# Patient Record
Sex: Male | Born: 1955 | Race: Asian | Hispanic: No | Marital: Single | Smoking: Current every day smoker
Health system: Southern US, Community
[De-identification: ages and names within clinical notes are randomized; demographics above are authoritative.]

## PROBLEM LIST (undated history)

## (undated) DIAGNOSIS — I10 Essential (primary) hypertension: Secondary | ICD-10-CM

## (undated) DIAGNOSIS — E119 Type 2 diabetes mellitus without complications: Secondary | ICD-10-CM

---

## 2016-01-29 ENCOUNTER — Encounter: Payer: Self-pay | Admitting: Emergency Medicine

## 2016-01-29 ENCOUNTER — Emergency Department
Admission: EM | Admit: 2016-01-29 | Discharge: 2016-01-29 | Disposition: A | Discharge status: Home / Self Care | Payer: Self-pay | Attending: Emergency Medicine | Admitting: Emergency Medicine

## 2016-01-29 ENCOUNTER — Emergency Department: Payer: Self-pay

## 2016-01-29 DIAGNOSIS — R109 Unspecified abdominal pain: Secondary | ICD-10-CM

## 2016-01-29 DIAGNOSIS — I10 Essential (primary) hypertension: Secondary | ICD-10-CM | POA: Insufficient documentation

## 2016-01-29 DIAGNOSIS — N200 Calculus of kidney: Secondary | ICD-10-CM | POA: Insufficient documentation

## 2016-01-29 DIAGNOSIS — E119 Type 2 diabetes mellitus without complications: Secondary | ICD-10-CM | POA: Insufficient documentation

## 2016-01-29 DIAGNOSIS — F17200 Nicotine dependence, unspecified, uncomplicated: Secondary | ICD-10-CM | POA: Insufficient documentation

## 2016-01-29 HISTORY — DX: Type 2 diabetes mellitus without complications: E11.9

## 2016-01-29 HISTORY — DX: Essential (primary) hypertension: I10

## 2016-01-29 LAB — URINALYSIS, COMPLETE (UACMP) WITH MICROSCOPIC
Bacteria, UA: NONE SEEN
Bilirubin Urine: NEGATIVE
Ketones, ur: 5 mg/dL — AB
Leukocytes, UA: NEGATIVE
NITRITE: NEGATIVE
Protein, ur: NEGATIVE mg/dL
SPECIFIC GRAVITY, URINE: 1.024 (ref 1.005–1.030)
pH: 5 (ref 5.0–8.0)

## 2016-01-29 LAB — CBC
HCT: 49.2 % (ref 40.0–52.0)
HEMOGLOBIN: 16.6 g/dL (ref 13.0–18.0)
MCH: 30.8 pg (ref 26.0–34.0)
MCHC: 33.7 g/dL (ref 32.0–36.0)
MCV: 91.7 fL (ref 80.0–100.0)
Platelets: 142 10*3/uL — ABNORMAL LOW (ref 150–440)
RBC: 5.37 MIL/uL (ref 4.40–5.90)
RDW: 13.6 % (ref 11.5–14.5)
WBC: 10.5 10*3/uL (ref 3.8–10.6)

## 2016-01-29 LAB — COMPREHENSIVE METABOLIC PANEL
ALK PHOS: 79 U/L (ref 38–126)
ALT: 24 U/L (ref 17–63)
ANION GAP: 9 (ref 5–15)
AST: 27 U/L (ref 15–41)
Albumin: 4.6 g/dL (ref 3.5–5.0)
BILIRUBIN TOTAL: 1.8 mg/dL — AB (ref 0.3–1.2)
BUN: 15 mg/dL (ref 6–20)
CALCIUM: 9.2 mg/dL (ref 8.9–10.3)
CO2: 23 mmol/L (ref 22–32)
Chloride: 105 mmol/L (ref 101–111)
Creatinine, Ser: 1.11 mg/dL (ref 0.61–1.24)
GLUCOSE: 234 mg/dL — AB (ref 65–99)
Potassium: 4.2 mmol/L (ref 3.5–5.1)
Sodium: 137 mmol/L (ref 135–145)
TOTAL PROTEIN: 7.9 g/dL (ref 6.5–8.1)

## 2016-01-29 LAB — LIPASE, BLOOD: Lipase: 26 U/L (ref 11–51)

## 2016-01-29 MED ORDER — KETOROLAC TROMETHAMINE 30 MG/ML IJ SOLN
INTRAMUSCULAR | Status: AC
  Filled 2016-01-29: qty 1

## 2016-01-29 MED ORDER — ONDANSETRON HCL 4 MG PO TABS: 4.0000 mg | ORAL_TABLET | Freq: Three times a day (TID) | ORAL | 0 refills | Status: DC | PRN

## 2016-01-29 MED ORDER — TRAMADOL HCL 50 MG PO TABS: 50.0000 mg | ORAL_TABLET | Freq: Four times a day (QID) | ORAL | 0 refills | Status: AC | PRN

## 2016-01-29 MED ORDER — KETOROLAC TROMETHAMINE 60 MG/2ML IM SOLN
60.0000 mg | Freq: Once | INTRAMUSCULAR | Status: AC
Start: 2016-01-29 — End: 2016-01-29
  Administered 2016-01-29: 60 mg via INTRAMUSCULAR
  Filled 2016-01-29: qty 2

## 2016-01-29 MED ORDER — TAMSULOSIN HCL 0.4 MG PO CAPS: 0.4000 mg | ORAL_CAPSULE | Freq: Every day | ORAL | 0 refills | Status: DC

## 2016-01-29 NOTE — ED Provider Notes (Signed)
[facility] Emergency Department Provider Note    ____________________________________________   I have reviewed the triage vital signs and the nursing notes.   HISTORY  Chief Complaint Abdominal Pain   History limited by: Not Limited   HPI Randall Glass is a 60 y.o. male who presents to the emergency department today because of concern for right lower quadrant abdominal pain. The pain has been going on for two days. The patient states it is located throughout the right side of his abdomen and does go into his groin. He has been having a hard time with urination with the pain. He denies any vomiting. Denies similar symptoms in the past.    Past Medical History:  Diagnosis Date  . Diabetes mellitus without complication (HCC)   . Hypertension     There are no active problems to display for this patient.   History reviewed. No pertinent surgical history.  Prior to Admission medications   Not on File    Allergies Patient has no known allergies.  No family history on file.  Social History Social History  Substance Use Topics  . Smoking status: Current Every Day Smoker  . Smokeless tobacco: Not on file     Comment: smokes hookah 1 time a day  . Alcohol use Not on file    Review of Systems  Constitutional: Negative for fever. Cardiovascular: Negative for chest pain. Respiratory: Negative for shortness of breath. Gastrointestinal: Positive for right lower quadrant pain. Genitourinary: Negative for dysuria. Musculoskeletal: Negative for back pain. Skin: Negative for rash. Neurological: Negative for headaches, focal weakness or numbness.  10-point ROS otherwise negative.  ____________________________________________   PHYSICAL EXAM:  VITAL SIGNS: ED Triage Vitals  Enc Vitals Group     BP 01/29/16 1425 (!) 154/99     Pulse Rate 01/29/16 1425 84     Resp 01/29/16 1425 18     Temp 01/29/16 1425 97.4 F (36.3 C)     Temp Source  01/29/16 1425 Oral     SpO2 01/29/16 1425 97 %     Weight 01/29/16 1426 170 lb (77.1 kg)     Height 01/29/16 1426 5\' 6"  (1.676 m)     Head Circumference --      Peak Flow --      Pain Score 01/29/16 1426 10    Constitutional: Alert and oriented. Well appearing and in no distress. Eyes: Conjunctivae are normal. Normal extraocular movements. ENT   Head: Normocephalic and atraumatic.   Nose: No congestion/rhinnorhea.   Mouth/Throat: Mucous membranes are moist.   Neck: No stridor. Hematological/Lymphatic/Immunilogical: No cervical lymphadenopathy. Cardiovascular: Normal rate, regular rhythm.  No murmurs, rubs, or gallops. Respiratory: Normal respiratory effort without tachypnea nor retractions. Breath sounds are clear and equal bilaterally. No wheezes/rales/rhonchi. Gastrointestinal: Soft and minimally tender to palpation in the right side of the abdomen. No rebound. No guarding. Genitourinary: Deferred Musculoskeletal: Normal range of motion in all extremities. No lower extremity edema. Neurologic:  Normal speech and language. No gross focal neurologic deficits are appreciated.  Skin:  Skin is warm, dry and intact. No rash noted. Psychiatric: Mood and affect are normal. Speech and behavior are normal. Patient exhibits appropriate insight and judgment.  ____________________________________________    LABS (pertinent positives/negatives)  Labs Reviewed  COMPREHENSIVE METABOLIC PANEL - Abnormal; Notable for the following:       Result Value   Glucose, Bld 234 (*)    Total Bilirubin 1.8 (*)    All other components within normal limits  CBC - Abnormal; Notable for the following:    Platelets 142 (*)    All other components within normal limits  URINALYSIS, COMPLETE (UACMP) WITH MICROSCOPIC - Abnormal; Notable for the following:    Color, Urine YELLOW (*)    APPearance CLEAR (*)    Glucose, UA >=500 (*)    Hgb urine dipstick MODERATE (*)    Ketones, ur 5 (*)     Squamous Epithelial / LPF 0-5 (*)    All other components within normal limits  LIPASE, BLOOD     ____________________________________________   EKG  None  ____________________________________________    RADIOLOGY  CT renal stone IMPRESSION:  Mild to moderate right hydronephrosis due to a 0.2 cm stone at the  right UVJ. No other acute abnormality.    Mild prostatomegaly.    Fatty infiltration of the liver.    Atherosclerosis.   ____________________________________________   PROCEDURES  Procedures  ____________________________________________   INITIAL IMPRESSION / ASSESSMENT AND PLAN / ED COURSE  Pertinent labs & imaging results that were available during my care of the patient were reviewed by me and considered in my medical decision making (see chart for details).  Patient presented to the emergency department today with concerns for right-sided abdominal pain. Workup is consistent with kidney stone. No signs of infection. Some mild to moderate hydronephrosis. Will plan on discharging with pain medication as medication Flomax.  ____________________________________________   FINAL CLINICAL IMPRESSION(S) / ED DIAGNOSES  Final diagnoses:  Right flank pain     Note: This dictation was prepared with Dragon dictation. Any transcriptional errors that result from this process are unintentional     Phineas SemenGraydon Brandye Inthavong, MD 01/29/16 1904

## 2016-01-29 NOTE — ED Notes (Signed)
The patient has a specimen cup but does not have to use the bathroom at this time.

## 2016-01-29 NOTE — ED Notes (Signed)
Right flank pain, urinary frequency and urgency. A/o. Vs wnl

## 2016-01-29 NOTE — Discharge Instructions (Signed)
Please seek medical attention for any high fevers, chest pain, shortness of breath, change in behavior, persistent vomiting, bloody stool or any other new or concerning symptoms.  

## 2016-01-29 NOTE — ED Triage Notes (Signed)
States lower R abdominal and flank pain since yesterday. Denies dysuria.

## 2016-01-31 ENCOUNTER — Emergency Department
Admission: EM | Admit: 2016-01-31 | Discharge: 2016-01-31 | Disposition: A | Discharge status: Home / Self Care | Payer: Self-pay | Attending: Emergency Medicine | Admitting: Emergency Medicine

## 2016-01-31 DIAGNOSIS — I10 Essential (primary) hypertension: Secondary | ICD-10-CM | POA: Insufficient documentation

## 2016-01-31 DIAGNOSIS — Z79899 Other long term (current) drug therapy: Secondary | ICD-10-CM | POA: Insufficient documentation

## 2016-01-31 DIAGNOSIS — F17200 Nicotine dependence, unspecified, uncomplicated: Secondary | ICD-10-CM | POA: Insufficient documentation

## 2016-01-31 DIAGNOSIS — N200 Calculus of kidney: Secondary | ICD-10-CM | POA: Insufficient documentation

## 2016-01-31 DIAGNOSIS — E119 Type 2 diabetes mellitus without complications: Secondary | ICD-10-CM | POA: Insufficient documentation

## 2016-01-31 DIAGNOSIS — K5900 Constipation, unspecified: Secondary | ICD-10-CM | POA: Insufficient documentation

## 2016-01-31 MED ORDER — POLYETHYLENE GLYCOL 3350 17 G PO PACK
PACK | ORAL | Status: AC
  Administered 2016-01-31: 17 g via ORAL
  Filled 2016-01-31: qty 1

## 2016-01-31 MED ORDER — SODIUM CHLORIDE 0.9 % IV BOLUS (SEPSIS)
1000.0000 mL | Freq: Once | INTRAVENOUS | Status: AC
  Administered 2016-01-31: 1000 mL via INTRAVENOUS

## 2016-01-31 MED ORDER — KETOROLAC TROMETHAMINE 30 MG/ML IJ SOLN
30.0000 mg | Freq: Once | INTRAMUSCULAR | Status: AC
  Administered 2016-01-31: 30 mg via INTRAVENOUS
  Filled 2016-01-31: qty 1

## 2016-01-31 MED ORDER — POLYETHYLENE GLYCOL 3350 17 G PO PACK
17.0000 g | PACK | Freq: Every day | ORAL | Status: DC
  Administered 2016-01-31: 17 g via ORAL

## 2016-01-31 MED ORDER — ONDANSETRON HCL 4 MG/2ML IJ SOLN
4.0000 mg | Freq: Once | INTRAMUSCULAR | Status: AC
  Administered 2016-01-31: 4 mg via INTRAVENOUS
  Filled 2016-01-31: qty 2

## 2016-01-31 NOTE — ED Notes (Signed)
MD Brown at bedside.

## 2016-01-31 NOTE — ED Notes (Signed)
Pt. Verbalizes understanding of d/c instructions and follow-up. VS stable and pain controlled per pt.  Pt. In NAD at time of d/c and denies further concerns regarding this visit. Pt. Stable at the time of departure from the unit, departing unit by the safest and most appropriate manner per that pt condition and limitations. Pt advised to return to the ED at any time for emergent concerns, or for new/worsening symptoms.   

## 2016-01-31 NOTE — ED Provider Notes (Signed)
[facility] Emergency Department Provider Note   First MD Initiated Contact with Patient 01/31/16 0040     (approximate)  I have reviewed the triage vital signs and the nursing notes.   HISTORY  Chief Complaint Flank Pain    HPI Randall Glass is a 60 y.o. male Randall Glass emergency department after being diagnosed with a kidney stone yesterday with recurrence of right flank pain which started at 6 PM tonight. Patient also admits to constipation 3 days. Patient denies any fever afebrile on presentation temperature 97.8.   Past Medical History:  Diagnosis Date  . Diabetes mellitus without complication (HCC)   . Hypertension     There are no active problems to display for this patient.   No past surgical history on file.  Prior to Admission medications   Medication Sig Start Date End Date Taking? Authorizing Provider  ondansetron (ZOFRAN) 4 MG tablet Take 1 tablet (4 mg total) by mouth every 8 (eight) hours as needed for nausea or vomiting. 01/29/16   Randall SemenGraydon Goodman, MD  tamsulosin (FLOMAX) 0.4 MG CAPS capsule Take 1 capsule (0.4 mg total) by mouth daily. 01/29/16   Randall SemenGraydon Goodman, MD  traMADol (ULTRAM) 50 MG tablet Take 1 tablet (50 mg total) by mouth every 6 (six) hours as needed. 01/29/16 01/28/17  Randall SemenGraydon Goodman, MD    Allergies No known drug allergies No family history on file.  Social History Social History  Substance Use Topics  . Smoking status: Current Every Day Smoker  . Smokeless tobacco: Not on file     Comment: smokes hookah 1 time a day  . Alcohol use Not on file    Review of Systems Constitutional: No fever/chills Eyes: No visual changes. ENT: No sore throat. Cardiovascular: Denies chest pain. Respiratory: Denies shortness of breath. Gastrointestinal: Positive for right flank pain  No nausea, no vomiting.  No diarrhea.  No constipation. Genitourinary: Negative for dysuria. Musculoskeletal: Negative for back pain. Skin:  Negative for rash. Neurological: Negative for headaches, focal weakness or numbness.  10-point ROS otherwise negative.  ____________________________________________   PHYSICAL EXAM:  VITAL SIGNS: ED Triage Vitals [01/31/16 0039]  Enc Vitals Group     BP (!) 155/88     Pulse Rate (!) 104     Resp 20     Temp 97.8 F (36.6 C)     Temp Source Oral     SpO2 92 %     Weight 170 lb (77.1 kg)     Height 5\' 6"  (1.676 m)     Head Circumference      Peak Flow      Pain Score 10     Pain Loc      Pain Edu?      Excl. in GC?     Constitutional: Alert and oriented. Well appearing and in no acute distress. Eyes: Conjunctivae are normal. PERRL. EOMI. Head: Atraumatic. Mouth/Throat: Mucous membranes are moist.  Oropharynx non-erythematous. Neck: No stridor.  Cardiovascular: Normal rate, regular rhythm. Good peripheral circulation. Grossly normal heart sounds. Respiratory: Normal respiratory effort.  No retractions. Lungs CTAB. Gastrointestinal: Soft and nontender. No distention.  Musculoskeletal: No lower extremity tenderness nor edema. No gross deformities of extremities. Neurologic:  Normal speech and language. No gross focal neurologic deficits are appreciated.  Skin:  Skin is warm, dry and intact. No rash noted.     Procedures   INITIAL IMPRESSION / ASSESSMENT AND PLAN / ED COURSE  Pertinent labs & imaging results that were  available during my care of the patient were reviewed by me and considered in my medical decision making (see chart for details).     Clinical Course     ____________________________________________  FINAL CLINICAL IMPRESSION(S) / ED DIAGNOSES  Final diagnoses:  Kidney stone  Constipation, unspecified constipation type     MEDICATIONS GIVEN DURING THIS VISIT:  Medications  polyethylene glycol (MIRALAX / GLYCOLAX) packet 17 g (17 g Oral Given 01/31/16 0119)  ketorolac (TORADOL) 30 MG/ML injection 30 mg (30 mg Intravenous Given 01/31/16  0119)  sodium chloride 0.9 % bolus 1,000 mL (1,000 mLs Intravenous New Bag/Given 01/31/16 0058)  ondansetron (ZOFRAN) injection 4 mg (4 mg Intravenous Given 01/31/16 0058)     NEW OUTPATIENT MEDICATIONS STARTED DURING THIS VISIT:  New Prescriptions   No medications on file    Modified Medications   No medications on file    Discontinued Medications   No medications on file     Note:  This document was prepared using Dragon voice recognition software and may include unintentional dictation errors.    Randall Currentandolph N Anjulie Dipierro, MD 01/31/16 [phone]

## 2016-01-31 NOTE — ED Triage Notes (Signed)
Pt was dx with kidney stones here yesterday, today at 1800 started having worsening right flank pain.

## 2016-01-31 NOTE — ED Notes (Signed)
Pt. Presents to tx room diaphoretic, writhing around in bed c/o lack of BM x 3 days, frequent urination, abdominal/flank pain right side.

## 2016-02-01 ENCOUNTER — Emergency Department
Admission: EM | Admit: 2016-02-01 | Discharge: 2016-02-01 | Disposition: A | Discharge status: Home / Self Care | Payer: Self-pay | Attending: Emergency Medicine | Admitting: Emergency Medicine

## 2016-02-01 ENCOUNTER — Encounter: Payer: Self-pay | Admitting: Emergency Medicine

## 2016-02-01 ENCOUNTER — Emergency Department: Payer: Self-pay

## 2016-02-01 DIAGNOSIS — R1030 Lower abdominal pain, unspecified: Secondary | ICD-10-CM | POA: Insufficient documentation

## 2016-02-01 DIAGNOSIS — K5900 Constipation, unspecified: Secondary | ICD-10-CM | POA: Insufficient documentation

## 2016-02-01 DIAGNOSIS — E119 Type 2 diabetes mellitus without complications: Secondary | ICD-10-CM | POA: Insufficient documentation

## 2016-02-01 DIAGNOSIS — R109 Unspecified abdominal pain: Secondary | ICD-10-CM

## 2016-02-01 DIAGNOSIS — F17200 Nicotine dependence, unspecified, uncomplicated: Secondary | ICD-10-CM | POA: Insufficient documentation

## 2016-02-01 DIAGNOSIS — I10 Essential (primary) hypertension: Secondary | ICD-10-CM | POA: Insufficient documentation

## 2016-02-01 LAB — COMPREHENSIVE METABOLIC PANEL
ALK PHOS: 55 U/L (ref 38–126)
ALT: 17 U/L (ref 17–63)
AST: 20 U/L (ref 15–41)
Albumin: 4.2 g/dL (ref 3.5–5.0)
Anion gap: 9 (ref 5–15)
BUN: 14 mg/dL (ref 6–20)
CO2: 25 mmol/L (ref 22–32)
CREATININE: 1.17 mg/dL (ref 0.61–1.24)
Calcium: 8.9 mg/dL (ref 8.9–10.3)
Chloride: 103 mmol/L (ref 101–111)
GFR calc Af Amer: >60 mL/min (ref >60)
Glucose, Bld: 190 mg/dL — ABNORMAL HIGH (ref 65–99)
Potassium: 4.4 mmol/L (ref 3.5–5.1)
Sodium: 137 mmol/L (ref 135–145)
TOTAL PROTEIN: 7.5 g/dL (ref 6.5–8.1)
Total Bilirubin: 2.1 mg/dL — ABNORMAL HIGH (ref 0.3–1.2)

## 2016-02-01 LAB — CBC
HCT: 45.5 % (ref 40.0–52.0)
Hemoglobin: 15.4 g/dL (ref 13.0–18.0)
MCH: 30.3 pg (ref 26.0–34.0)
MCHC: 33.8 g/dL (ref 32.0–36.0)
MCV: 89.8 fL (ref 80.0–100.0)
PLATELETS: 160 10*3/uL (ref 150–440)
RBC: 5.06 MIL/uL (ref 4.40–5.90)
RDW: 13.5 % (ref 11.5–14.5)
WBC: 6.4 10*3/uL (ref 3.8–10.6)

## 2016-02-01 LAB — URINALYSIS, COMPLETE (UACMP) WITH MICROSCOPIC
BILIRUBIN URINE: NEGATIVE
Bacteria, UA: NONE SEEN
Glucose, UA: 150 mg/dL — AB
Ketones, ur: 80 mg/dL — AB
LEUKOCYTES UA: NEGATIVE
NITRITE: NEGATIVE
PH: 5 (ref 5.0–8.0)
Protein, ur: 100 mg/dL — AB
SPECIFIC GRAVITY, URINE: 1.02 (ref 1.005–1.030)

## 2016-02-01 LAB — LIPASE, BLOOD: Lipase: 21 U/L (ref 11–51)

## 2016-02-01 MED ORDER — MINERAL OIL RE ENEM
1.0000 | ENEMA | Freq: Once | RECTAL | Status: AC
  Administered 2016-02-01: 1 via RECTAL

## 2016-02-01 MED ORDER — DOCUSATE SODIUM 100 MG PO CAPS
100.0000 mg | ORAL_CAPSULE | Freq: Once | ORAL | Status: AC
  Administered 2016-02-01: 100 mg via ORAL
  Filled 2016-02-01: qty 1

## 2016-02-01 MED ORDER — LACTULOSE 10 GM/15ML PO SOLN
10.0000 g | Freq: Once | ORAL | Status: AC
  Administered 2016-02-01: 10 g via ORAL
  Filled 2016-02-01: qty 30

## 2016-02-01 MED ORDER — MAGNESIUM CITRATE PO SOLN
0.5000 | Freq: Once | ORAL | Status: AC
  Administered 2016-02-01: 0.5 via ORAL
  Filled 2016-02-01: qty 296

## 2016-02-01 NOTE — ED Notes (Signed)
Enema unsuccessful, no stool in toilet.  2nd soap suds enema given

## 2016-02-01 NOTE — ED Provider Notes (Addendum)
[facility] Emergency Department Provider Note  ____________________________________________   First MD Initiated Contact with Patient 02/01/16 1526     (approximate)  I have reviewed the triage vital signs and the nursing notes.   HISTORY  Chief Complaint Abdominal Pain   HPI Randall Glass is a 60 y.o. male with a history of diabetes and hypertension was presenting with 6 days of lower quadrant cramping. He says that he was seen her several days ago and this is third visit. He was diagnosed with a kidney stone during his first visit and then given medicine for constipation during his second visit. However, he says that he is not passing any gas or having any balance over the past 6 days. He denies any nausea or vomiting at this time.   Past Medical History:  Diagnosis Date  . Diabetes mellitus without complication (HCC)   . Hypertension     There are no active problems to display for this patient.   History reviewed. No pertinent surgical history.  Prior to Admission medications   Medication Sig Start Date End Date Taking? Authorizing Provider  ondansetron (ZOFRAN) 4 MG tablet Take 1 tablet (4 mg total) by mouth every 8 (eight) hours as needed for nausea or vomiting. 01/29/16   Phineas Semen, MD  tamsulosin (FLOMAX) 0.4 MG CAPS capsule Take 1 capsule (0.4 mg total) by mouth daily. 01/29/16   Phineas Semen, MD  traMADol (ULTRAM) 50 MG tablet Take 1 tablet (50 mg total) by mouth every 6 (six) hours as needed. 01/29/16 01/28/17  Phineas Semen, MD    Allergies Patient has no known allergies.  No family history on file.  Social History Social History  Substance Use Topics  . Smoking status: Current Every Day Smoker  . Smokeless tobacco: Never Used     Comment: smokes hookah 1 time a day  . Alcohol use No    Review of Systems Constitutional: No fever/chills Eyes: No visual changes. ENT: No sore throat. Cardiovascular: Denies chest  pain. Respiratory: Denies shortness of breath. Gastrointestinal:  No nausea, no vomiting.  No diarrhea.   Genitourinary: Negative for dysuria. Musculoskeletal: Negative for back pain. Skin: Negative for rash. Neurological: Negative for headaches, focal weakness or numbness.  10-point ROS otherwise negative.  ____________________________________________   PHYSICAL EXAM:  VITAL SIGNS: ED Triage Vitals  Enc Vitals Group     BP 02/01/16 1253 (!) 157/81     Pulse Rate 02/01/16 1253 96     Resp 02/01/16 1253 20     Temp 02/01/16 1253 97.8 F (36.6 C)     Temp Source 02/01/16 1253 Oral     SpO2 02/01/16 1253 97 %     Weight 02/01/16 1254 165 lb (74.8 kg)     Height 02/01/16 1254 5\' 6"  (1.676 m)     Head Circumference --      Peak Flow --      Pain Score 02/01/16 1254 10     Pain Loc --      Pain Edu? --      Excl. in GC? --     Constitutional: Alert and oriented. Well appearing and in no acute distress. Eyes: Conjunctivae are normal. PERRL. EOMI. Head: Atraumatic. Nose: No congestion/rhinnorhea. Mouth/Throat: Mucous membranes are moist.   Neck: No stridor.   Cardiovascular: Normal rate, regular rhythm. Grossly normal heart sounds.  Good peripheral circulation. Respiratory: Normal respiratory effort.  No retractions. Lungs CTAB. Gastrointestinal: Soft With minimal lower abdominal tenderness to palpation.  No distention. No fecal impaction on rectal exam. Musculoskeletal: No lower extremity tenderness nor edema.  No joint effusions. Neurologic:  Normal speech and language. No gross focal neurologic deficits are appreciated. No gait instability. Skin:  Skin is warm, dry and intact. No rash noted. Psychiatric: Mood and affect are normal. Speech and behavior are normal.  ____________________________________________   LABS (all labs ordered are listed, but only abnormal results are displayed)  Labs Reviewed  COMPREHENSIVE METABOLIC PANEL - Abnormal; Notable for the following:        Result Value   Glucose, Bld 190 (*)    Total Bilirubin 2.1 (*)    All other components within normal limits  URINALYSIS, COMPLETE (UACMP) WITH MICROSCOPIC - Abnormal; Notable for the following:    Color, Urine YELLOW (*)    APPearance CLEAR (*)    Glucose, UA 150 (*)    Hgb urine dipstick LARGE (*)    Ketones, ur 80 (*)    Protein, ur 100 (*)    Squamous Epithelial / LPF 0-5 (*)    All other components within normal limits  LIPASE, BLOOD  CBC   ____________________________________________  EKG   ____________________________________________  RADIOLOGY   DG Abd Acute W/Chest (Final result)  Result time 02/01/16 18:11:43  Final result by Rubye OaksMelanie Ehinger, MD (02/01/16 18:11:43)           Narrative:   CLINICAL DATA: Lower abdominal pain. Recent kidney stone. Patient reports no bowel movement for 5 days.  EXAM: DG ABDOMEN ACUTE W/ 1V CHEST  COMPARISON: CT 01/29/2016  FINDINGS: Mild atelectasis at the left lung base. Lungs are otherwise clear. Heart size is normal.  No dilated small bowel loops. There is mild gaseous distention of transverse and splenic flexure of the colon. Large stool burden in the right colon, with small volume of sigmoid colonic stool. The 2 mm stone previously at the right ureterovesicular junction is not seen. No radiopaque calculi. No free air. No acute osseous abnormality.  IMPRESSION: 1. Large stool burden in the right colon, with mild gaseous distention of transverse colon. No small bowel dilatation to suggest small bowel obstruction. Findings suggest colonic ileus. 2. The 2 mm stone at the right ureterovesicular junction on prior CT is not seen, may have passed or is obscured and not well visualized radiographically.   Electronically Signed By: Rubye OaksMelanie Ehinger M.D. On: 02/01/2016 18:11          ____________________________________________   PROCEDURES  Procedure(s) performed:   Procedures  Critical Care  performed:   ____________________________________________   INITIAL IMPRESSION / ASSESSMENT AND PLAN / ED COURSE  Pertinent labs & imaging results that were available during my care of the patient were reviewed by me and considered in my medical decision making (see chart for details).   Clinical Course   ----------------------------------------- 8:43 PM on 02/01/2016 -----------------------------------------  Patient has had 2 soapsuds enemas at this time as well as a mineral oil enema and lactulose as well as mag citrate orally with only minimal progress with his stooling. He still is saying that he has 10 out of 10 lower abdominal pain although he is in no acute distress at this time. He will be admitted to the hospital for a bowel regimen. Signed out to Dr. Emmit PomfretHugelmeyer.  I also discussed the case with Dr. Everlene FarrierPabon of surgery who requested that the patient be admitted to the medicine service.   ____________________________________________   FINAL CLINICAL IMPRESSION(S) / ED DIAGNOSES  Abdominal pain. Constipation.  NEW MEDICATIONS STARTED DURING THIS VISIT:  New Prescriptions   No medications on file     Note:  This document was prepared using Dragon voice recognition software and may include unintentional dictation errors.    Myrna Blazeravid Matthew Nasiah Polinsky, MD 02/01/16 2044  Patient at this time says that he has had 6 large bowel movements. He says that he has no pain. He'll be discharged home. I also instructed him to stop taking his tramadol. Appears that his stone is passed as there is no evidence of stone on his abdominal x-rays. However, he still appears to have some residual blood in the urine. We discussed diet such as taking plenty of water as well as fiber such as fruits and vegetables and fiber cereals and fiber bars. He also has MiraLAX at home to use as needed. He'll be discharged home to follow up with his primary care doctor.    Myrna Blazeravid Matthew Christoph Copelan,  MD 02/01/16 2132

## 2016-02-01 NOTE — ED Triage Notes (Signed)
Patient to ER for lower abdominal pain. Has history of recent kidney stones. States he has had pain for 5 days. States he has not had BM in 5 days as well. Unable to pass flatus.

## 2016-02-01 NOTE — ED Notes (Signed)
Pt had 2 small, pebble like stools.

## 2016-02-01 NOTE — ED Notes (Signed)
Pt sts that he had a BM, "first one in 5 days".  Reports feeling better, MD Schaevitz informed

## 2017-08-11 IMAGING — CT CT RENAL STONE PROTOCOL
2 of 4 series · 16 of 46 positions shown, 18 images · non-contrast
Comparison: None.

CLINICAL DATA: Right abdominal and flank pain since yesterday.

EXAM:
CT ABDOMEN AND PELVIS WITHOUT CONTRAST
TECHNIQUE: Multidetector CT imaging of the abdomen and pelvis was performed
following the standard protocol without IV contrast.

[Series 2: stone full standard · axial · 0.76mm/px · z∈[-653,-228]mm · 13 of 93 slices shown, 15 images]
[im 4/93  soft-tissue]
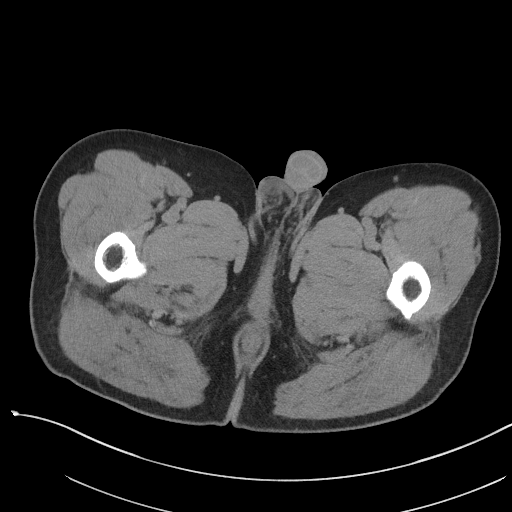
[im 4/93  bone]
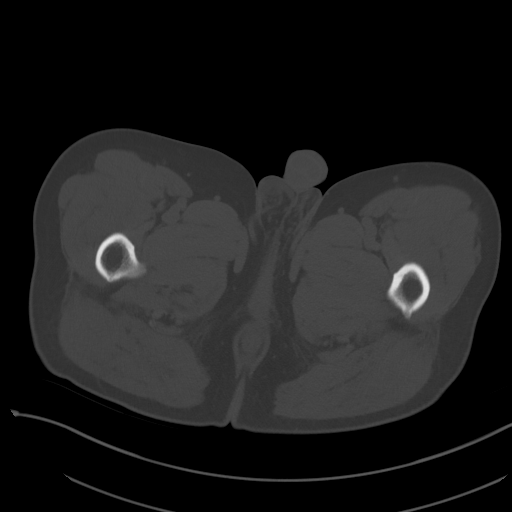
[im 12/93  soft-tissue]
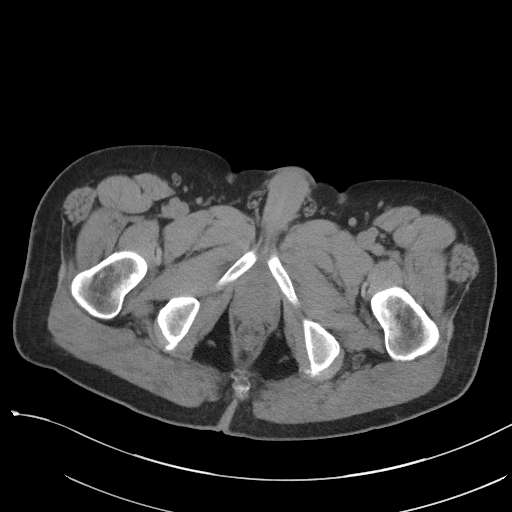
[im 19/93  soft-tissue]
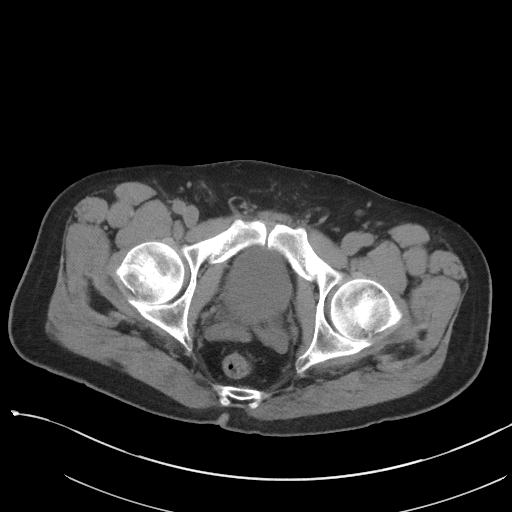
[im 26/93  soft-tissue]
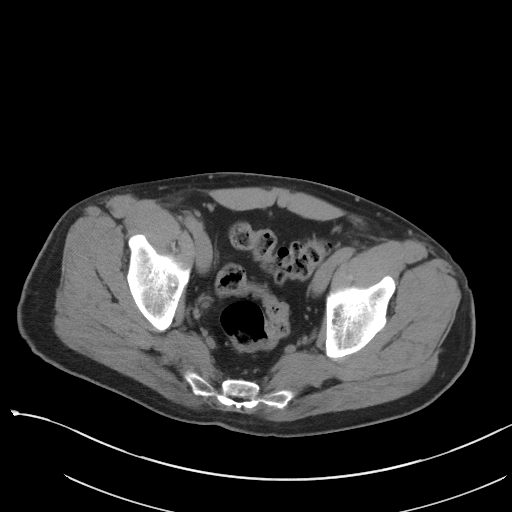
[im 34/93  soft-tissue]
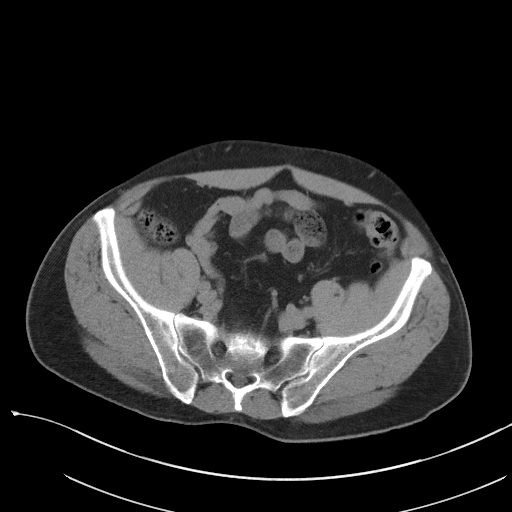
[im 41/93  soft-tissue]
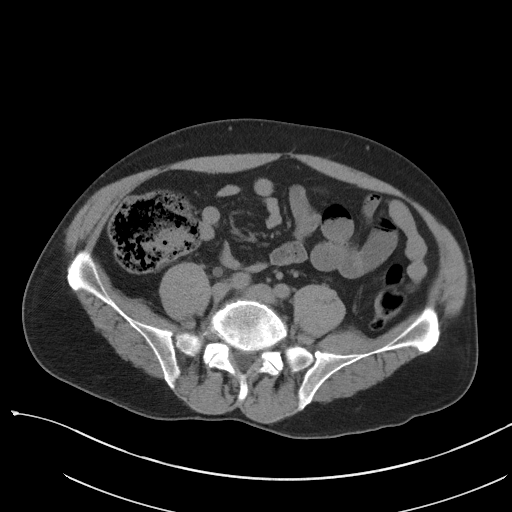
[im 48/93  soft-tissue]
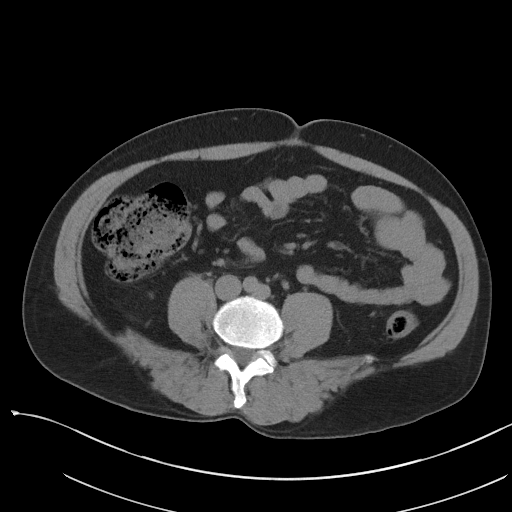
[im 52/93  soft-tissue]
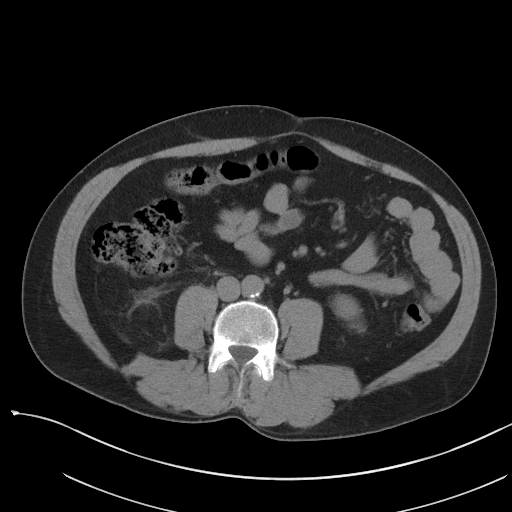
[im 59/93  soft-tissue]
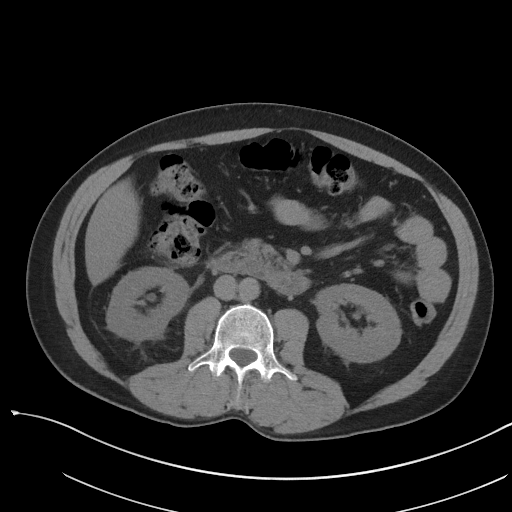
[im 59/93  bone]
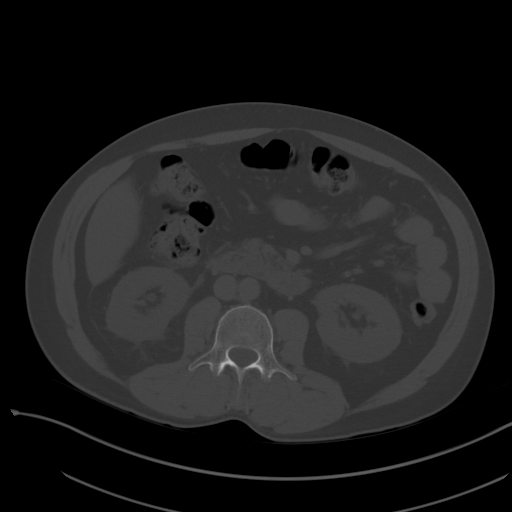
[im 67/93  soft-tissue]
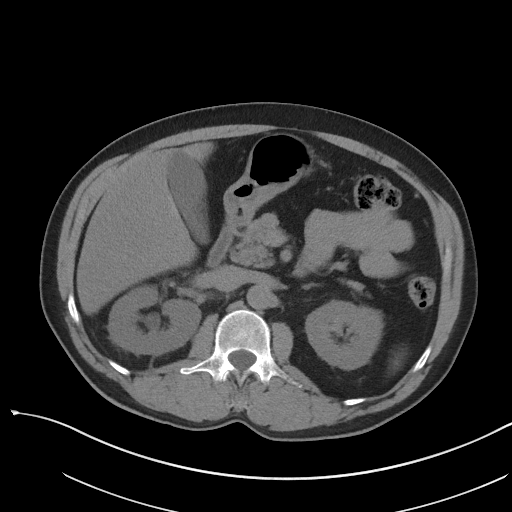
[im 74/93  soft-tissue]
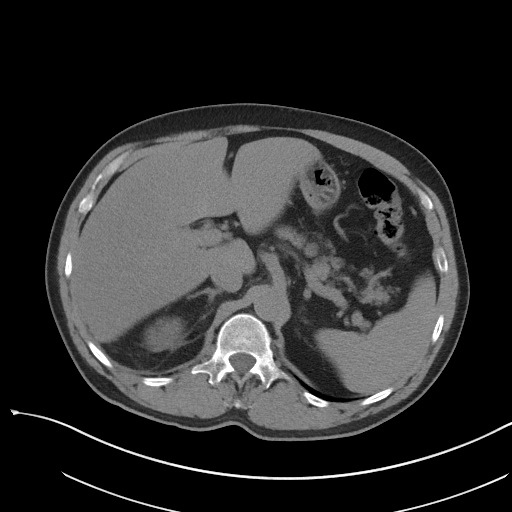
[im 81/93  soft-tissue]
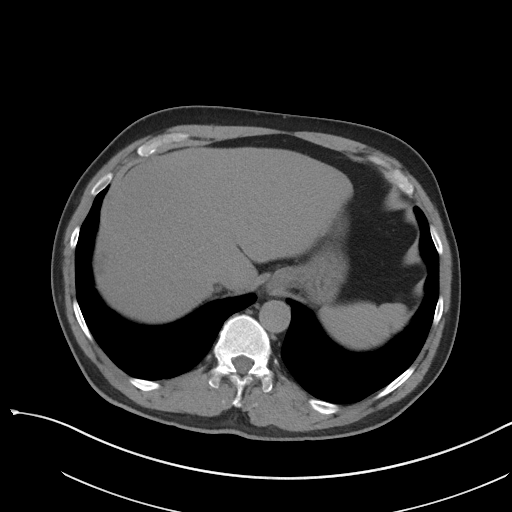
[im 89/93  soft-tissue]
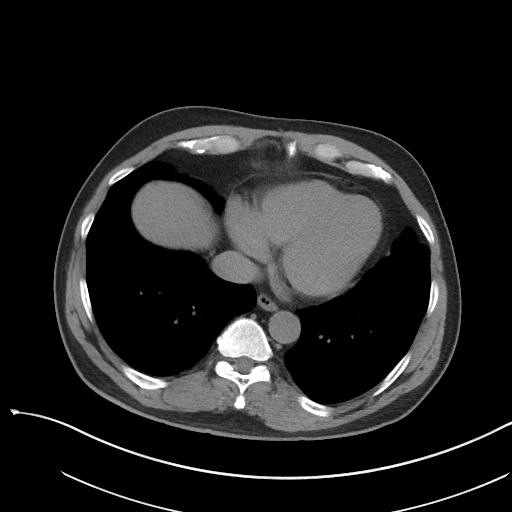

[Series 5: coronal · coronal · 0.78mm/px · 3 of 121 slices shown]
[im 41/121  soft-tissue]
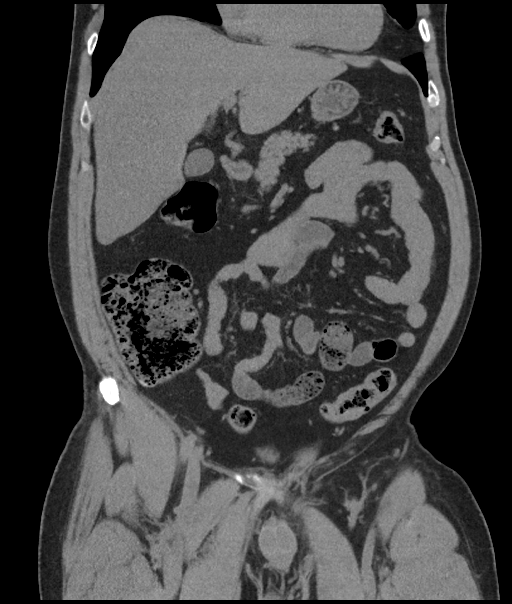
[im 54/121  soft-tissue]
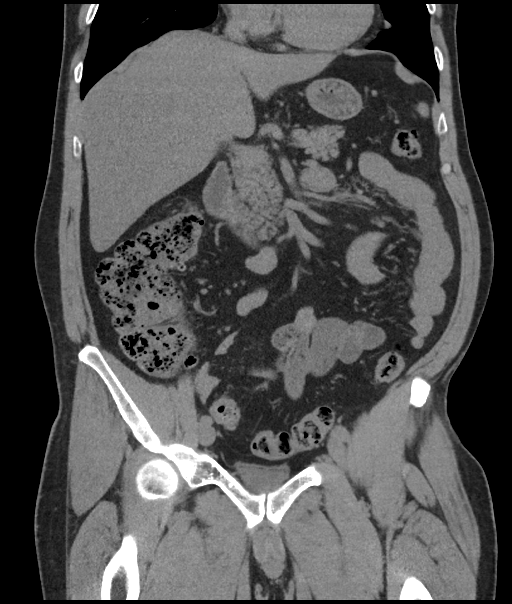
[im 67/121  soft-tissue]
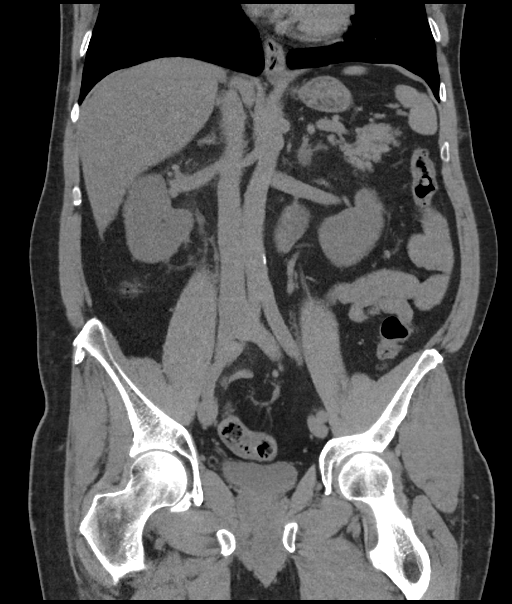

[16 of 46 positions shown; findings below may reference images not displayed]

FINDINGS: Lower chest: Lung bases are clear. No pleural or pericardial
effusion

Hepatobiliary: There is diffuse fatty infiltration of the liver. No
focal lesion. Gallbladder and biliary tree are unremarkable.

Pancreas: Unremarkable. No pancreatic ductal dilatation or
surrounding inflammatory changes.

Spleen: Normal in size without focal abnormality.

Adrenals/Urinary Tract: There is mild to moderate right
hydronephrosis with stranding about the right kidney and ureter due
to a 0.2 cm stone at the right ureterovesical junction. No other
urinary tract stones are identified on the right or left. Small cyst
in the midpole the right kidney is noted. The left kidney, ureter
and urinary bladder all appear normal. The adrenal glands are normal
appearance.

Stomach/Bowel: Stomach is within normal limits. Appendix appears
normal. No evidence of bowel wall thickening, distention, or
inflammatory changes.

Vascular/Lymphatic: A few scattered aortic atherosclerotic
calcifications are identified. No aneurysm. No lymphadenopathy.

Reproductive: Prostate gland is mildly prominent.

Other: No hernia or fluid collection.

Musculoskeletal: Mild appearing degenerative disc disease L4-5 and
L5-S1 noted. Small sclerotic focus in the right femoral neck is
likely a bone island.
IMPRESSION: Mild to moderate right hydronephrosis due to a 0.2 cm stone at the
right UVJ. No other acute abnormality.

Mild prostatomegaly.

Fatty infiltration of the liver.

Atherosclerosis.

## 2017-12-13 ENCOUNTER — Other Ambulatory Visit: Payer: Self-pay

## 2017-12-13 ENCOUNTER — Encounter: Payer: Self-pay | Admitting: Adult Health

## 2017-12-13 ENCOUNTER — Ambulatory Visit: Payer: Self-pay | Admitting: Adult Health

## 2017-12-13 VITALS — BP 120/74 | HR 91 | Temp 98.3°F | Wt 160.0 lb

## 2017-12-13 DIAGNOSIS — R05 Cough: Secondary | ICD-10-CM

## 2017-12-13 DIAGNOSIS — E7849 Other hyperlipidemia: Secondary | ICD-10-CM

## 2017-12-13 DIAGNOSIS — M25562 Pain in left knee: Secondary | ICD-10-CM

## 2017-12-13 DIAGNOSIS — R059 Cough, unspecified: Secondary | ICD-10-CM

## 2017-12-13 DIAGNOSIS — I10 Essential (primary) hypertension: Secondary | ICD-10-CM

## 2017-12-13 DIAGNOSIS — E119 Type 2 diabetes mellitus without complications: Secondary | ICD-10-CM | POA: Insufficient documentation

## 2017-12-13 MED ORDER — LISINOPRIL 2.5 MG PO TABS: 2.5000 mg | ORAL_TABLET | Freq: Every day | ORAL | 0 refills | Status: DC

## 2017-12-13 MED ORDER — METFORMIN HCL 1000 MG PO TABS: 1000.0000 mg | ORAL_TABLET | Freq: Two times a day (BID) | ORAL | 0 refills | Status: DC

## 2017-12-13 MED ORDER — ATORVASTATIN CALCIUM 20 MG PO TABS: 20.0000 mg | ORAL_TABLET | Freq: Every day | ORAL | 0 refills | Status: DC

## 2017-12-13 MED ORDER — GLIPIZIDE ER 10 MG PO TB24: 10.0000 mg | ORAL_TABLET | Freq: Every day | ORAL | 0 refills | Status: DC

## 2017-12-13 NOTE — Progress Notes (Signed)
  A&P:  1. Type 2 diabetes mellitus without complication, without long-term current use of insulin (HCC) Refill provided for metformin and glipizide Check labs F/U 2 weeks  2. Essential hypertension Stable on medication Refill lisinopril Check labs  3. Other hyperlipidemia Check lipid profile. Refill lipitor  4. Acute pain of left knee Patient noticed pain ~ 2 months ago while kneeling to pray on hard surface Has not taken any OTC medication consistently Instructed to avoid kneeling to pray until area healed Aleve bid x 7 days Apply ice to area for 20 min. May do 3 times daily RTC if no improvement    5. Cough Mucinex OTC. RTC if no improvement    HPI  1. Refill on medications 2. Diabetes management 3. Htn management 4. Left lower leg pain 5. cough    ROS:  Normal except for c/o left lower leg pain and cough   Physical Exam:  Gen: WNL Pulm cta CV: rrr, no bruits, no murmurs Abd: normal MS: normal ROM

## 2018-01-17 ENCOUNTER — Ambulatory Visit: Payer: Self-pay | Admitting: Internal Medicine

## 2018-01-17 ENCOUNTER — Encounter: Payer: Self-pay | Admitting: Family Medicine

## 2018-01-17 DIAGNOSIS — M1710 Unilateral primary osteoarthritis, unspecified knee: Secondary | ICD-10-CM | POA: Insufficient documentation

## 2018-01-17 DIAGNOSIS — S39012A Strain of muscle, fascia and tendon of lower back, initial encounter: Secondary | ICD-10-CM

## 2018-01-17 MED ORDER — MELOXICAM 15 MG PO TABS: 15.0000 mg | ORAL_TABLET | Freq: Every day | ORAL | 1 refills | Status: DC

## 2018-01-17 NOTE — Progress Notes (Signed)
Established Patient Office Visit  Subjective:  Patient ID: Randall Glass, male    DOB: 11/28/1955  Age: 62 y.o. MRN: [id number]  CC:  Chief Complaint  Patient presents with  . Follow-up  . Back Pain  . Knee Pain    HPI Randall PostinHamid Degrave presents for lower back pain and knee pain after driving to and from [place]. Pain relieved by [employer] Balm and otc motrin.  Past Medical History:  Diagnosis Date  . Diabetes mellitus without complication (HCC)   . Hypertension     History reviewed. No pertinent surgical history.  History reviewed. No pertinent family history.  Social History   Socioeconomic History  . Marital status: Single    Spouse name: Not on file  . Number of children: Not on file  . Years of education: Not on file  . Highest education level: Not on file  Occupational History  . Not on file  Social Needs  . Financial resource strain: Not on file  . Food insecurity:    Worry: Not on file    Inability: Not on file  . Transportation needs:    Medical: Not on file    Non-medical: Not on file  Tobacco Use  . Smoking status: Current Every Day Smoker  . Smokeless tobacco: Never Used  . Tobacco comment: smokes hookah 1 time a day  Substance and Sexual Activity  . Alcohol use: No  . Drug use: Not on file  . Sexual activity: Not on file  Lifestyle  . Physical activity:    Days per week: Not on file    Minutes per session: Not on file  . Stress: Not on file  Relationships  . Social connections:    Talks on phone: Not on file    Gets together: Not on file    Attends religious service: Not on file    Active member of club or organization: Not on file    Attends meetings of clubs or organizations: Not on file    Relationship status: Not on file  . Intimate partner violence:    Fear of current or ex partner: Not on file    Emotionally abused: Not on file    Physically abused: Not on file    Forced sexual activity: Not on file  Other Topics Concern  . Not on file   Social History Narrative  . Not on file    Outpatient Medications Prior to Visit  Medication Sig Dispense Refill  . atorvastatin (LIPITOR) 20 MG tablet Take 1 tablet (20 mg total) by mouth daily. 180 tablet 0  . glipiZIDE (GLUCOTROL XL) 10 MG 24 hr tablet Take 1 tablet (10 mg total) by mouth daily with breakfast. 90 tablet 0  . lisinopril (PRINIVIL,ZESTRIL) 2.5 MG tablet Take 1 tablet (2.5 mg total) by mouth daily. 180 tablet 0  . metFORMIN (GLUCOPHAGE) 1000 MG tablet Take 1 tablet (1,000 mg total) by mouth 2 (two) times daily with a meal. 360 tablet 0  . ondansetron (ZOFRAN) 4 MG tablet Take 1 tablet (4 mg total) by mouth every 8 (eight) hours as needed for nausea or vomiting. 20 tablet 0  . tamsulosin (FLOMAX) 0.4 MG CAPS capsule Take 1 capsule (0.4 mg total) by mouth daily. 14 capsule 0   No facility-administered medications prior to visit.     No Known Allergies  ROS Review of Systems    Objective:    Physical Exam  Constitutional: He appears well-developed. No distress.  Musculoskeletal: Normal range  of motion.       Right knee: He exhibits normal patellar mobility.       Left knee: No tenderness found.  Crepitus Lumbar paraspinal tenderness    BP 121/70 (BP Location: Left Arm, Patient Position: Sitting, Cuff Size: Normal)   Pulse 100   Temp (!) 95.3 F (35.2 C)   Ht 5\' 7"  (1.702 m)   Wt 165 lb (74.8 kg)   SpO2 98%   BMI 25.84 kg/m  Wt Readings from Last 3 Encounters:  01/17/18 165 lb (74.8 kg)  02/01/16 165 lb (74.8 kg)  01/31/16 170 lb (77.1 kg)     Health Maintenance Due  Topic Date Due  . HEMOGLOBIN A1C  1955/12/05  . Hepatitis C Screening  Nov 03, 1955  . FOOT EXAM  04/13/1965  . OPHTHALMOLOGY EXAM  04/13/1965  . HIV Screening  04/14/1970  . TETANUS/TDAP  04/14/1974  . COLONOSCOPY  04/13/2005  . INFLUENZA VACCINE  09/11/2017    There are no preventive care reminders to display for this patient.  No results found for: TSH Lab Results   Component Value Date   WBC 6.4 02/01/2016   HGB 15.4 02/01/2016   HCT 45.5 02/01/2016   MCV 89.8 02/01/2016   PLT 160 02/01/2016   Lab Results  Component Value Date   NA 137 02/01/2016   K 4.4 02/01/2016   CO2 25 02/01/2016   GLUCOSE 190 (H) 02/01/2016   BUN 14 02/01/2016   CREATININE 1.17 02/01/2016   BILITOT 2.1 (H) 02/01/2016   ALKPHOS 55 02/01/2016   AST 20 02/01/2016   ALT 17 02/01/2016   PROT 7.5 02/01/2016   ALBUMIN 4.2 02/01/2016   CALCIUM 8.9 02/01/2016   ANIONGAP 9 02/01/2016   No results found for: CHOL No results found for: HDL No results found for: LDLCALC No results found for: TRIG No results found for: CHOLHDL No results found for: UJWJ1B    Assessment & Plan:   Problem List Items Addressed This Visit    None      No orders of the defined types were placed in this encounter. Order nsaids and antispasmodics  Follow-up: No follow-ups on file.    Luna Fuse, MD

## 2018-04-18 ENCOUNTER — Encounter: Payer: Self-pay | Admitting: Internal Medicine

## 2018-04-18 ENCOUNTER — Ambulatory Visit: Payer: Self-pay | Admitting: Internal Medicine

## 2018-04-18 DIAGNOSIS — K5900 Constipation, unspecified: Secondary | ICD-10-CM

## 2018-04-18 MED ORDER — DOCUSATE SODIUM 100 MG PO CAPS: 100.0000 mg | ORAL_CAPSULE | Freq: Two times a day (BID) | ORAL | 0 refills | Status: DC

## 2018-04-18 MED ORDER — POLYETHYLENE GLYCOL 3350 17 GM/SCOOP PO POWD: 17.0000 g | Freq: Two times a day (BID) | ORAL | 1 refills | Status: AC | PRN

## 2018-04-18 NOTE — Progress Notes (Signed)
Acute Office Visit  Subjective:    Patient ID: Randall Glass, male    DOB: 06-27-1955, 63 y.o.   MRN: [id number]  No chief complaint on file.   HPI Patient is in today for abdominal pain and constipation x 3 d. Denies N/V. See ROS. Stool hard and also c/o abdominal colicky pain and borborygmi.  Past Medical History:  Diagnosis Date  . Diabetes mellitus without complication (HCC)   . Hypertension     No past surgical history on file.  No family history on file.  Social History   Socioeconomic History  . Marital status: Single    Spouse name: Not on file  . Number of children: Not on file  . Years of education: Not on file  . Highest education level: Not on file  Occupational History  . Not on file  Social Needs  . Financial resource strain: Not on file  . Food insecurity:    Worry: Not on file    Inability: Not on file  . Transportation needs:    Medical: Not on file    Non-medical: Not on file  Tobacco Use  . Smoking status: Current Every Day Smoker  . Smokeless tobacco: Never Used  . Tobacco comment: smokes hookah 1 time a day  Substance and Sexual Activity  . Alcohol use: No  . Drug use: Not on file  . Sexual activity: Not on file  Lifestyle  . Physical activity:    Days per week: Not on file    Minutes per session: Not on file  . Stress: Not on file  Relationships  . Social connections:    Talks on phone: Not on file    Gets together: Not on file    Attends religious service: Not on file    Active member of club or organization: Not on file    Attends meetings of clubs or organizations: Not on file    Relationship status: Not on file  . Intimate partner violence:    Fear of current or ex partner: Not on file    Emotionally abused: Not on file    Physically abused: Not on file    Forced sexual activity: Not on file  Other Topics Concern  . Not on file  Social History Narrative  . Not on file    Outpatient Medications Prior to Visit  Medication  Sig Dispense Refill  . atorvastatin (LIPITOR) 20 MG tablet Take 1 tablet (20 mg total) by mouth daily. 180 tablet 0  . glipiZIDE (GLUCOTROL XL) 10 MG 24 hr tablet Take 1 tablet (10 mg total) by mouth daily with breakfast. 90 tablet 0  . lisinopril (PRINIVIL,ZESTRIL) 2.5 MG tablet Take 1 tablet (2.5 mg total) by mouth daily. 180 tablet 0  . meloxicam (MOBIC) 15 MG tablet Take 1 tablet (15 mg total) by mouth daily. 30 tablet 1  . metFORMIN (GLUCOPHAGE) 1000 MG tablet Take 1 tablet (1,000 mg total) by mouth 2 (two) times daily with a meal. 360 tablet 0  . ondansetron (ZOFRAN) 4 MG tablet Take 1 tablet (4 mg total) by mouth every 8 (eight) hours as needed for nausea or vomiting. 20 tablet 0  . tamsulosin (FLOMAX) 0.4 MG CAPS capsule Take 1 capsule (0.4 mg total) by mouth daily. 14 capsule 0   No facility-administered medications prior to visit.     No Known Allergies  Review of Systems  Constitutional: Negative for chills, fever and malaise/fatigue.  Gastrointestinal: Positive for abdominal pain and  constipation. Negative for blood in stool, melena, nausea and vomiting.       Objective:    Physical Exam  Constitutional: He appears well-developed.  143/84, p93,t98.4,O2 sat 97%  Cardiovascular: Normal rate and regular rhythm.  Pulmonary/Chest: Breath sounds normal.  Abdominal: Soft. Bowel sounds are normal. He exhibits no distension. There is no splenomegaly or hepatomegaly. There is abdominal tenderness in the left lower quadrant.    Neurological: He is alert.    There were no vitals taken for this visit. Wt Readings from Last 3 Encounters:  04/26/17 162 lb 6.4 oz (73.7 kg)  01/17/18 165 lb (74.8 kg)  02/01/16 165 lb (74.8 kg)    Health Maintenance Due  Topic Date Due  . HEMOGLOBIN A1C  1955/10[birth year]  . Hepatitis C Screening  1955-05-22  . FOOT EXAM  04/13/1965  . OPHTHALMOLOGY EXAM  04/13/1965  . HIV Screening  04/14/1970  . TETANUS/TDAP  04/14/1974  . COLONOSCOPY  04/13/2005   . INFLUENZA VACCINE  09/11/2017    There are no preventive care reminders to display for this patient.   No results found for: TSH Lab Results  Component Value Date   WBC 6.4 02/01/2016   HGB 15.4 02/01/2016   HCT 45.5 02/01/2016   MCV 89.8 02/01/2016   PLT 160 02/01/2016   Lab Results  Component Value Date   NA 137 02/01/2016   K 4.4 02/01/2016   CO2 25 02/01/2016   GLUCOSE 190 (H) 02/01/2016   BUN 14 02/01/2016   CREATININE 1.17 02/01/2016   BILITOT 2.1 (H) 02/01/2016   ALKPHOS 55 02/01/2016   AST 20 02/01/2016   ALT 17 02/01/2016   PROT 7.5 02/01/2016   ALBUMIN 4.2 02/01/2016   CALCIUM 8.9 02/01/2016   ANIONGAP 9 02/01/2016   No results found for: CHOL No results found for: HDL No results found for: LDLCALC No results found for: TRIG No results found for: CHOLHDL No results found for: LTRV2Y     Assessment & Plan:   Problem List Items Addressed This Visit    None    Visit Diagnoses    Constipation, unspecified constipation type    -  Primary     Constipation, unspecified constipation type   No orders of the defined types were placed in this encounter.  Laxatives ordered, RTC if no improvement otherwise 3 mo f/u  Luna Fuse, MD

## 2018-07-18 ENCOUNTER — Ambulatory Visit: Payer: Self-pay | Admitting: Internal Medicine

## 2018-08-24 ENCOUNTER — Encounter: Payer: Self-pay | Admitting: Internal Medicine

## 2018-12-18 DIAGNOSIS — U071 COVID-19: Secondary | ICD-10-CM | POA: Insufficient documentation

## 2018-12-19 ENCOUNTER — Other Ambulatory Visit: Payer: Self-pay

## 2018-12-19 ENCOUNTER — Telehealth: Payer: Self-pay | Admitting: Family Medicine

## 2018-12-19 ENCOUNTER — Telehealth: Payer: Self-pay | Admitting: Cardiovascular Disease

## 2018-12-19 DIAGNOSIS — E119 Type 2 diabetes mellitus without complications: Secondary | ICD-10-CM

## 2018-12-19 DIAGNOSIS — E782 Mixed hyperlipidemia: Secondary | ICD-10-CM

## 2018-12-19 DIAGNOSIS — U071 COVID-19: Secondary | ICD-10-CM

## 2018-12-19 MED ORDER — ATORVASTATIN CALCIUM 20 MG PO TABS: 20.0000 mg | ORAL_TABLET | Freq: Every day | ORAL | 0 refills | Status: DC

## 2018-12-19 MED ORDER — GLIPIZIDE ER 10 MG PO TB24: 10.0000 mg | ORAL_TABLET | Freq: Every day | ORAL | 0 refills | Status: DC

## 2018-12-19 MED ORDER — LISINOPRIL 2.5 MG PO TABS: 2.5000 mg | ORAL_TABLET | Freq: Every day | ORAL | 0 refills | Status: DC

## 2018-12-19 MED ORDER — METFORMIN HCL 1000 MG PO TABS: 1000.0000 mg | ORAL_TABLET | Freq: Two times a day (BID) | ORAL | 0 refills | Status: DC

## 2018-12-19 NOTE — Progress Notes (Signed)
Patient needs refill on his medications No complain O Patient is looking good , no SOB, breathing normal  A/p Type 2 diabetes mellitus Hyperlipdemia  P Requested Prescriptions   Signed Prescriptions Disp Refills  . atorvastatin (LIPITOR) 20 MG tablet 180 tablet 0    Sig: Take 1 tablet (20 mg total) by mouth daily.  Marland Kitchen glipiZIDE (GLUCOTROL XL) 10 MG 24 hr tablet 90 tablet 0    Sig: Take 1 tablet (10 mg total) by mouth daily with breakfast.  . lisinopril (ZESTRIL) 2.5 MG tablet 180 tablet 0    Sig: Take 1 tablet (2.5 mg total) by mouth daily.  . metFORMIN (GLUCOPHAGE) 1000 MG tablet 360 tablet 0    Sig: Take 1 tablet (1,000 mg total) by mouth 2 (two) times daily with a meal.

## 2018-12-19 NOTE — Patient Instructions (Signed)

## 2018-12-22 ENCOUNTER — Other Ambulatory Visit: Payer: Self-pay | Admitting: Family Medicine

## 2018-12-23 LAB — LIPID PANEL WITH LDL/HDL RATIO
Cholesterol, Total: 111 mg/dL (ref 100–199)
HDL: 27 mg/dL — ABNORMAL LOW (ref >39)
LDL Chol Calc (NIH): 44 mg/dL (ref 0–99)
LDL/HDL Ratio: 1.6 ratio (ref 0.0–3.6)
Triglycerides: 255 mg/dL — ABNORMAL HIGH (ref 0–149)
VLDL Cholesterol Cal: 40 mg/dL (ref 5–40)

## 2018-12-23 LAB — COMPREHENSIVE METABOLIC PANEL
ALT: 19 IU/L (ref 0–44)
AST: 16 IU/L (ref 0–40)
Albumin/Globulin Ratio: 2.3 — ABNORMAL HIGH (ref 1.2–2.2)
Albumin: 4.5 g/dL (ref 3.8–4.8)
Alkaline Phosphatase: 84 IU/L (ref 39–117)
BUN/Creatinine Ratio: 14 (ref 10–24)
BUN: 13 mg/dL (ref 8–27)
Bilirubin Total: 1 mg/dL (ref 0.0–1.2)
CO2: 23 mmol/L (ref 20–29)
Calcium: 9 mg/dL (ref 8.6–10.2)
Chloride: 104 mmol/L (ref 96–106)
Creatinine, Ser: 0.91 mg/dL (ref 0.76–1.27)
GFR calc Af Amer: 103 mL/min/{1.73_m2} (ref >59)
GFR calc non Af Amer: 89 mL/min/{1.73_m2} (ref >59)
Globulin, Total: 2 g/dL (ref 1.5–4.5)
Glucose: 202 mg/dL — ABNORMAL HIGH (ref 65–99)
Potassium: 4.4 mmol/L (ref 3.5–5.2)
Sodium: 143 mmol/L (ref 134–144)
Total Protein: 6.5 g/dL (ref 6.0–8.5)

## 2018-12-23 LAB — HGB A1C W/O EAG: Hgb A1c MFr Bld: 8.3 % — ABNORMAL HIGH (ref 4.8–5.6)

## 2018-12-28 ENCOUNTER — Other Ambulatory Visit: Payer: Self-pay

## 2018-12-28 DIAGNOSIS — Z20822 Contact with and (suspected) exposure to covid-19: Secondary | ICD-10-CM

## 2018-12-30 LAB — NOVEL CORONAVIRUS, NAA: SARS-CoV-2, NAA: NOT DETECTED

## 2019-05-21 MED ORDER — GLIPIZIDE ER 10 MG PO TB24: 10.0000 mg | ORAL_TABLET | Freq: Every day | ORAL | 0 refills | Status: DC

## 2019-05-21 MED ORDER — METFORMIN HCL 1000 MG PO TABS: 1000.0000 mg | ORAL_TABLET | Freq: Two times a day (BID) | ORAL | 0 refills | Status: DC

## 2019-05-21 NOTE — Addendum Note (Signed)
Addended by: Mordecai Rasmussen on: 05/21/2019 03:25 PM   Modules accepted: Orders

## 2019-06-12 ENCOUNTER — Telehealth: Payer: Self-pay | Admitting: Family Medicine

## 2019-06-19 ENCOUNTER — Telehealth: Payer: Self-pay | Admitting: Family Medicine

## 2019-07-17 ENCOUNTER — Ambulatory Visit: Payer: Self-pay | Admitting: Internal Medicine

## 2019-09-04 ENCOUNTER — Ambulatory Visit: Payer: Self-pay | Admitting: Internal Medicine

## 2019-10-30 ENCOUNTER — Ambulatory Visit: Payer: Self-pay | Admitting: Internal Medicine

## 2020-01-15 ENCOUNTER — Other Ambulatory Visit: Payer: Self-pay

## 2020-01-15 MED ORDER — LISINOPRIL 2.5 MG PO TABS: 2.5000 mg | ORAL_TABLET | Freq: Every day | ORAL | 0 refills | Status: DC

## 2020-01-15 MED ORDER — GLIPIZIDE ER 10 MG PO TB24: 10.0000 mg | ORAL_TABLET | Freq: Every day | ORAL | 0 refills | Status: DC

## 2020-01-15 MED ORDER — METFORMIN HCL 1000 MG PO TABS: 1000.0000 mg | ORAL_TABLET | Freq: Two times a day (BID) | ORAL | 0 refills | Status: DC

## 2020-01-15 MED ORDER — ATORVASTATIN CALCIUM 20 MG PO TABS: 20.0000 mg | ORAL_TABLET | Freq: Every day | ORAL | 0 refills | Status: DC

## 2022-12-28 ENCOUNTER — Ambulatory Visit: Payer: Self-pay | Admitting: Internal Medicine

## 2022-12-28 ENCOUNTER — Encounter: Payer: Self-pay | Admitting: Internal Medicine

## 2022-12-28 VITALS — BP 128/83 | HR 84 | Temp 97.5°F | Ht 68.0 in | Wt 157.8 lb

## 2022-12-28 DIAGNOSIS — E782 Mixed hyperlipidemia: Secondary | ICD-10-CM

## 2022-12-28 DIAGNOSIS — I10 Essential (primary) hypertension: Secondary | ICD-10-CM | POA: Insufficient documentation

## 2022-12-28 DIAGNOSIS — E119 Type 2 diabetes mellitus without complications: Secondary | ICD-10-CM

## 2022-12-28 MED ORDER — ATORVASTATIN CALCIUM 20 MG PO TABS: 20.0000 mg | ORAL_TABLET | Freq: Every day | ORAL | 0 refills | Status: DC

## 2022-12-28 MED ORDER — GLIPIZIDE ER 10 MG PO TB24: 10.0000 mg | ORAL_TABLET | Freq: Every day | ORAL | 0 refills | Status: DC

## 2022-12-28 MED ORDER — METFORMIN HCL 1000 MG PO TABS: 1000.0000 mg | ORAL_TABLET | Freq: Two times a day (BID) | ORAL | 1 refills | Status: DC

## 2022-12-28 MED ORDER — LISINOPRIL 2.5 MG PO TABS: 2.5000 mg | ORAL_TABLET | Freq: Every day | ORAL | 0 refills | Status: DC

## 2022-12-28 NOTE — Progress Notes (Signed)
Established Patient Office Visit  Subjective:  Patient ID: Randall Glass, male    DOB: [birth year]  Age: 67 y.o. MRN: [id number]  Chief Complaint  Patient presents with   Diabetes    No new complaints, here for lab review and medication refills. Lost his medications in his luggage while returning to the [place] and hasn't taken any thing since then.    No other concerns at this time.   Past Medical History:  Diagnosis Date   Diabetes mellitus without complication (HCC)    Hypertension     No past surgical history on file.  Social History   Socioeconomic History   Marital status: Single    Spouse name: Not on file   Number of children: Not on file   Years of education: Not on file   Highest education level: Not on file  Occupational History   Not on file  Tobacco Use   Smoking status: Every Day   Smokeless tobacco: Never   Tobacco comments:    smokes hookah 1 time a day  Substance and Sexual Activity   Alcohol use: No   Drug use: Not on file   Sexual activity: Not on file  Other Topics Concern   Not on file  Social History Narrative   Not on file   Social Determinants of Health   Financial Resource Strain: Not on file  Food Insecurity: Not on file  Transportation Needs: Not on file  Physical Activity: Not on file  Stress: Not on file  Social Connections: Not on file  Intimate Partner Violence: Not on file    No family history on file.  No Known Allergies  Outpatient Medications Prior to Visit  Medication Sig   polyethylene glycol powder (GLYCOLAX/MIRALAX) powder Take 17 g by mouth 2 (two) times daily as needed.   [DISCONTINUED] atorvastatin (LIPITOR) 20 MG tablet Take 1 tablet (20 mg total) by mouth daily.   [DISCONTINUED] glipiZIDE (GLUCOTROL XL) 10 MG 24 hr tablet Take 1 tablet (10 mg total) by mouth daily with breakfast.   [DISCONTINUED] lisinopril (ZESTRIL) 2.5 MG tablet Take 1 tablet (2.5 mg total) by mouth daily.   [DISCONTINUED] metFORMIN  (GLUCOPHAGE) 1000 MG tablet Take 1 tablet (1,000 mg total) by mouth 2 (two) times daily with a meal.   No facility-administered medications prior to visit.    Review of Systems  Constitutional: Negative.   HENT: Negative.    Eyes: Negative.   Respiratory: Negative.    Cardiovascular: Negative.   Gastrointestinal: Negative.   Genitourinary: Negative.   Skin: Negative.   Neurological: Negative.   Endo/Heme/Allergies: Negative.        Objective:   BP 128/83 (BP Location: Right Arm, Patient Position: Sitting, Cuff Size: Normal)   Pulse 84   Temp (!) 97.5 F (36.4 C)   Ht 5\' 8"  (1.727 m)   Wt 157 lb 12.8 oz (71.6 kg)   SpO2 90%   BMI 23.99 kg/m   Vitals:   12/28/22 1040  BP: 128/83  Pulse: 84  Temp: (!) 97.5 F (36.4 C)  Height: 5\' 8"  (1.727 m)  Weight: 157 lb 12.8 oz (71.6 kg)  SpO2: 90%  BMI (Calculated): 24    Physical Exam Vitals reviewed.  Constitutional:      Appearance: Normal appearance.  HENT:     Head: Normocephalic.     Left Ear: There is no impacted cerumen.     Nose: Nose normal.     Mouth/Throat:  Mouth: Mucous membranes are moist.     Pharynx: No posterior oropharyngeal erythema.  Eyes:     Extraocular Movements: Extraocular movements intact.     Pupils: Pupils are equal, round, and reactive to light.  Cardiovascular:     Rate and Rhythm: Regular rhythm.     Chest Wall: PMI is not displaced.     Pulses: Normal pulses.     Heart sounds: Normal heart sounds. No murmur heard. Pulmonary:     Effort: Pulmonary effort is normal.     Breath sounds: Normal air entry. No rhonchi or rales.  Abdominal:     General: Abdomen is flat. Bowel sounds are normal. There is no distension.     Palpations: Abdomen is soft. There is no hepatomegaly, splenomegaly or mass.     Tenderness: There is no abdominal tenderness.  Musculoskeletal:        General: Normal range of motion.     Cervical back: Normal range of motion and neck supple.     Right lower  leg: No edema.     Left lower leg: No edema.  Skin:    General: Skin is warm and dry.  Neurological:     General: No focal deficit present.     Mental Status: He is alert and oriented to person, place, and time.     Cranial Nerves: No cranial nerve deficit.     Motor: No weakness.  Psychiatric:        Mood and Affect: Mood normal.        Behavior: Behavior normal.      No results found for any visits on 12/28/22.  No results found for this or any previous visit (from the past 2160 hour(Braian Tijerina)).    Assessment & Plan:  As per problem list  Problem List Items Addressed This Visit       Cardiovascular and Mediastinum   Primary hypertension   Relevant Medications   atorvastatin (LIPITOR) 20 MG tablet   lisinopril (ZESTRIL) 2.5 MG tablet     Endocrine   Diabetes mellitus type 2, uncomplicated (HCC) - Primary   Relevant Medications   atorvastatin (LIPITOR) 20 MG tablet   glipiZIDE (GLUCOTROL XL) 10 MG 24 hr tablet   lisinopril (ZESTRIL) 2.5 MG tablet   metFORMIN (GLUCOPHAGE) 1000 MG tablet   Other Visit Diagnoses     Mixed hyperlipidemia       Relevant Medications   atorvastatin (LIPITOR) 20 MG tablet   lisinopril (ZESTRIL) 2.5 MG tablet       Return in about 1 month (around 01/27/2023) for fu with labs prior.   Total time spent: 20 minutes  Luna Fuse, MD  12/28/2022   This document may have been prepared by [employer] Voice Recognition software and as such may include unintentional dictation errors.

## 2023-01-11 ENCOUNTER — Ambulatory Visit: Payer: Self-pay | Admitting: Internal Medicine

## 2023-01-28 ENCOUNTER — Other Ambulatory Visit: Payer: Self-pay | Admitting: Internal Medicine

## 2023-01-29 LAB — COMPREHENSIVE METABOLIC PANEL
ALT: 24 [IU]/L (ref 0–44)
AST: 17 [IU]/L (ref 0–40)
Albumin: 4.3 g/dL (ref 3.9–4.9)
Alkaline Phosphatase: 95 [IU]/L (ref 44–121)
BUN/Creatinine Ratio: 16 (ref 10–24)
BUN: 16 mg/dL (ref 8–27)
Bilirubin Total: 0.8 mg/dL (ref 0.0–1.2)
CO2: 22 mmol/L (ref 20–29)
Calcium: 9.6 mg/dL (ref 8.6–10.2)
Chloride: 104 mmol/L (ref 96–106)
Creatinine, Ser: 1 mg/dL (ref 0.76–1.27)
Globulin, Total: 2.5 g/dL (ref 1.5–4.5)
Glucose: 213 mg/dL — ABNORMAL HIGH (ref 70–99)
Potassium: 5.2 mmol/L (ref 3.5–5.2)
Sodium: 140 mmol/L (ref 134–144)
Total Protein: 6.8 g/dL (ref 6.0–8.5)
eGFR: 82 mL/min/{1.73_m2} (ref >59)

## 2023-01-29 LAB — LIPID PANEL W/O CHOL/HDL RATIO
Cholesterol, Total: 93 mg/dL — ABNORMAL LOW (ref 100–199)
HDL: 25 mg/dL — ABNORMAL LOW (ref >39)
LDL Chol Calc (NIH): 41 mg/dL (ref 0–99)
Triglycerides: 156 mg/dL — ABNORMAL HIGH (ref 0–149)
VLDL Cholesterol Cal: 27 mg/dL (ref 5–40)

## 2023-01-29 LAB — CBC WITH DIFFERENTIAL/PLATELET
Basophils Absolute: 0.1 10*3/uL (ref 0.0–0.2)
Basos: 1 %
EOS (ABSOLUTE): 0.8 10*3/uL — ABNORMAL HIGH (ref 0.0–0.4)
Eos: 10 %
Hematocrit: 49.3 % (ref 37.5–51.0)
Hemoglobin: 15.8 g/dL (ref 13.0–17.7)
Immature Grans (Abs): 0 10*3/uL (ref 0.0–0.1)
Immature Granulocytes: 0 %
Lymphocytes Absolute: 2.9 10*3/uL (ref 0.7–3.1)
Lymphs: 38 %
MCH: 30.7 pg (ref 26.6–33.0)
MCHC: 32 g/dL (ref 31.5–35.7)
MCV: 96 fL (ref 79–97)
Monocytes Absolute: 0.4 10*3/uL (ref 0.1–0.9)
Monocytes: 6 %
Neutrophils Absolute: 3.5 10*3/uL (ref 1.4–7.0)
Neutrophils: 45 %
Platelets: 196 10*3/uL (ref 150–450)
RBC: 5.15 x10E6/uL (ref 4.14–5.80)
RDW: 12.4 % (ref 11.6–15.4)
WBC: 7.7 10*3/uL (ref 3.4–10.8)

## 2023-01-29 LAB — HGB A1C W/O EAG: Hgb A1c MFr Bld: 10.1 % — ABNORMAL HIGH (ref 4.8–5.6)

## 2023-02-01 ENCOUNTER — Ambulatory Visit: Payer: Self-pay | Admitting: Gastroenterology

## 2023-02-01 ENCOUNTER — Other Ambulatory Visit: Payer: Self-pay

## 2023-02-01 ENCOUNTER — Encounter: Payer: Self-pay | Admitting: Gastroenterology

## 2023-02-01 DIAGNOSIS — E782 Mixed hyperlipidemia: Secondary | ICD-10-CM

## 2023-02-01 DIAGNOSIS — E119 Type 2 diabetes mellitus without complications: Secondary | ICD-10-CM

## 2023-02-01 MED ORDER — GLIPIZIDE ER 10 MG PO TB24: 10.0000 mg | ORAL_TABLET | Freq: Two times a day (BID) | ORAL | 1 refills | Status: DC

## 2023-02-01 MED ORDER — ATORVASTATIN CALCIUM 20 MG PO TABS: 20.0000 mg | ORAL_TABLET | Freq: Every day | ORAL | 0 refills | Status: DC

## 2023-02-01 NOTE — Care Management (Signed)
 Patient here today because his hemoglobin A1C is elevated.No other complains.

## 2023-02-01 NOTE — Progress Notes (Signed)
 [name] , M.D. Gastroenterology & Hepatology [facility] [address] [place] ,[place] [address] Primary Care Physician: Titus Mould, NP No address on file  Chief Complaint:  diabetes  History of Present Illness: Randall Glass is a 67 y.o. male who presents for evaluation of   Patient here today because his hemoglobin A1C is elevated.No other complains.       Hemoglobin A1C: 8.3--->10.1  The patient denies having any nausea, vomiting, fever, chills, hematochezia, melena, hematemesis, abdominal distention, abdominal pain, diarrhea, jaundice, pruritus or weight loss.  Past Medical History: Past Medical History:  Diagnosis Date   Diabetes mellitus without complication (HCC)    Hypertension     Past Surgical History:History reviewed. No pertinent surgical history.  Family History:History reviewed. No pertinent family history.  Social History: Social History   Tobacco Use  Smoking Status Every Day  Smokeless Tobacco Never  Tobacco Comments   smokes hookah 1 time a day   Social History   Substance and Sexual Activity  Alcohol Use No   Social History   Substance and Sexual Activity  Drug Use Not on file    Allergies: No Known Allergies  Medications: Current Outpatient Medications  Medication Sig Dispense Refill   atorvastatin (LIPITOR) 20 MG tablet Take 1 tablet (20 mg total) by mouth daily. 180 tablet 0   glipiZIDE (GLUCOTROL XL) 10 MG 24 hr tablet Take 1 tablet (10 mg total) by mouth daily with breakfast. 90 tablet 0   lisinopril (ZESTRIL) 2.5 MG tablet Take 1 tablet (2.5 mg total) by mouth daily. 180 tablet 0   metFORMIN (GLUCOPHAGE) 1000 MG tablet Take 1 tablet (1,000 mg total) by mouth 2 (two) times daily with a meal. 180 tablet 1   polyethylene glycol powder (GLYCOLAX/MIRALAX) powder Take 17 g by mouth 2 (two) times daily as needed. 3350 g 1   No current facility-administered medications for this visit.    Review  of Systems: GENERAL: negative for malaise, night sweats HEENT: No changes in hearing or vision, no nose bleeds or other nasal problems. NECK: Negative for lumps, goiter, pain and significant neck swelling RESPIRATORY: Negative for cough, wheezing CARDIOVASCULAR: Negative for chest pain, leg swelling, palpitations, orthopnea GI: SEE HPI MUSCULOSKELETAL: Negative for joint pain or swelling, back pain, and muscle pain. SKIN: Negative for lesions, rash HEMATOLOGY Negative for prolonged bleeding, bruising easily, and swollen nodes. ENDOCRINE: Negative for cold or heat intolerance, polyuria, polydipsia and goiter. NEURO: negative for tremor, gait imbalance, syncope and seizures. The remainder of the review of systems is noncontributory.   Physical Exam: BP 127/60 (BP Location: Right Arm, Patient Position: Sitting, Cuff Size: Small)   Pulse 87   Temp 98.4 F (36.9 C) (Oral)   Ht 5' 7.5" (1.715 m)   Wt 157 lb 3.2 oz (71.3 kg)   SpO2 96%   BMI 24.26 kg/m  GENERAL: The patient is AO x3, in no acute distress. HEENT: Head is normocephalic and atraumatic. EOMI are intact. Mouth is well hydrated and without lesions. NECK: Supple. No masses LUNGS: Clear to auscultation. No presence of rhonchi/wheezing/rales. Adequate chest expansion HEART: RRR, normal s1 and s2. ABDOMEN: Soft, nontender, no guarding, no peritoneal signs, and nondistended. BS +. No masses.   Imaging/Labs: as above     Latest Ref Rng & Units 01/28/2023   12:00 AM 02/01/2016   12:56 PM 01/29/2016    2:28 PM  CBC  WBC 3.4 - 10.8 x10E3/uL 7.7  6.4  10.5  Hemoglobin 13.0 - 17.7 g/dL 96.0  45.4  09.8   Hematocrit 37.5 - 51.0 % 49.3  45.5  49.2   Platelets 150 - 450 x10E3/uL 196  160  142    No results found for: "IRON", "TIBC", "FERRITIN"  I personally reviewed and interpreted the available labs, imaging and endoscopic files.  Impression and Plan: Randall Glass is a 67 y.o. male who presents for evaluation of elevated  Hemoglobin A1c   Will optimize glipizide, increase 10mg  to 20mg  . Check A1c in 3 months   All questions were answered.      [name], MD Gastroenterology and Hepatology   This chart has been completed using [employer] [facility] Dictation software, and while attempts have been made to ensure accuracy , certain words and phrases may not be transcribed as intended

## 2023-05-17 ENCOUNTER — Ambulatory Visit: Payer: Self-pay | Admitting: Gastroenterology

## 2023-05-24 ENCOUNTER — Ambulatory Visit: Payer: Self-pay | Admitting: Family Medicine

## 2023-05-24 ENCOUNTER — Encounter: Payer: Self-pay | Admitting: Family Medicine

## 2023-05-24 VITALS — BP 133/91 | HR 81 | Temp 97.9°F | Ht 67.0 in | Wt 158.4 lb

## 2023-05-24 DIAGNOSIS — E119 Type 2 diabetes mellitus without complications: Secondary | ICD-10-CM

## 2023-05-24 MED ORDER — ATORVASTATIN CALCIUM 20 MG PO TABS: 20.0000 mg | ORAL_TABLET | Freq: Every day | ORAL | 3 refills | Status: DC

## 2023-05-24 MED ORDER — GLIPIZIDE ER 10 MG PO TB24: ORAL_TABLET | ORAL | 1 refills | Status: DC

## 2023-05-24 MED ORDER — LISINOPRIL 5 MG PO TABS: 5.0000 mg | ORAL_TABLET | Freq: Every day | ORAL | 3 refills | Status: DC

## 2023-05-24 MED ORDER — METFORMIN HCL 1000 MG PO TABS: 1000.0000 mg | ORAL_TABLET | Freq: Two times a day (BID) | ORAL | 1 refills | Status: DC

## 2023-05-24 NOTE — Progress Notes (Signed)
 F/u for DM No complain  A/ DM type 2  Hyperlipdemia  Cont current medications F/u lab 3 months

## 2023-06-18 NOTE — Progress Notes (Signed)
 Patient with type two Diabetes need refill on this medicine

## 2023-08-23 ENCOUNTER — Ambulatory Visit: Payer: Self-pay | Admitting: Cardiovascular Disease

## 2023-08-23 ENCOUNTER — Encounter: Payer: Self-pay | Admitting: Cardiovascular Disease

## 2023-08-23 DIAGNOSIS — E119 Type 2 diabetes mellitus without complications: Secondary | ICD-10-CM

## 2023-08-23 DIAGNOSIS — E782 Mixed hyperlipidemia: Secondary | ICD-10-CM

## 2023-08-23 MED ORDER — METFORMIN HCL 1000 MG PO TABS: 1000.0000 mg | ORAL_TABLET | Freq: Two times a day (BID) | ORAL | 1 refills | Status: DC

## 2023-08-23 MED ORDER — ATORVASTATIN CALCIUM 20 MG PO TABS: 20.0000 mg | ORAL_TABLET | Freq: Every day | ORAL | 3 refills | Status: DC

## 2023-08-23 MED ORDER — LISINOPRIL 5 MG PO TABS: 5.0000 mg | ORAL_TABLET | Freq: Every day | ORAL | 3 refills | Status: DC

## 2023-08-23 MED ORDER — GLIPIZIDE ER 10 MG PO TB24: ORAL_TABLET | ORAL | 1 refills | Status: DC

## 2023-08-23 MED ORDER — GLIMEPIRIDE 4 MG PO TABS: 4.0000 mg | ORAL_TABLET | Freq: Every day | ORAL | 3 refills | Status: DC

## 2023-08-23 NOTE — Addendum Note (Signed)
 Addended by: Steffie Waggoner A on: 08/23/2023 10:46 AM   Modules accepted: Orders

## 2023-08-23 NOTE — Progress Notes (Signed)
   Established Patient Office Visit  Subjective   Patient ID: Randall Glass, male    DOB: 1955/03/18  Age: 68 y.o. MRN: [id number]  No chief complaint on file.   HPI This is a 68 year old male who is here today for follow-up visit regarding type 2 diabetes, essential hypertension, tobacco use and hyperlipidemia. He has been doing well with no chest pain, shortness of breath or palpitations.  He goes to the gym twice a week. He does not smoke cigarettes but does smoke hookah on a daily basis.   ROS    Objective:     There were no vitals taken for this visit.   Physical Exam Lungs are clear to auscultation.  Heart is regular with no murmurs.  No results found for any visits on 08/23/23.    The ASCVD Risk score (Arnett DK, et al., 2019) failed to calculate for the following reasons:   The valid total cholesterol range is 130 to 320 mg/dL    Assessment & Plan:   1.  Type 2 diabetes: Not optimally controlled with most recent hemoglobin A1c of 10.1.  I discussed with him the importance of healthy diet and regular exercise.  I refilled his diabetic medications and will recheck hemoglobin A1c in 3 months.  2.  Essential hypertension: Lisinopril  was refilled blood pressure is controlled  3.  Hyperlipidemia: His LDL was at target.  Triglyceride was slightly high and HDL was low.  I refilled atorvastatin .  4.  Nonspecific rash on both arms with itching: Possible allergic reaction.  I gave him cortisone cream to be used for 1 week.  Problem List Items Addressed This Visit   None   Follow-up in 3 months after labs.   Deatrice Cage, MD

## 2023-09-27 ENCOUNTER — Ambulatory Visit: Payer: Self-pay | Admitting: Family Medicine

## 2023-11-15 ENCOUNTER — Ambulatory Visit: Payer: Self-pay | Admitting: Internal Medicine

## 2023-11-15 DIAGNOSIS — E782 Mixed hyperlipidemia: Secondary | ICD-10-CM

## 2023-11-15 DIAGNOSIS — E119 Type 2 diabetes mellitus without complications: Secondary | ICD-10-CM

## 2023-11-15 MED ORDER — ATORVASTATIN CALCIUM 20 MG PO TABS: 20.0000 mg | ORAL_TABLET | Freq: Every day | ORAL | 3 refills | Status: DC

## 2023-11-15 NOTE — Progress Notes (Signed)
 Internal MEDICINE  Office Visit Note  Patient Name: Randall Glass  [id number]  [id number]  Date of Service: 11/15/2023  Chief Complaint  Patient presents with   Follow-up    Sharp pain in the mid right back for the past week. Left sided chest pain for the past three months and is sudden and fleeting. Pt is currently fasting    HPI DM- HTN/ HLP f/u Last [facility] 10 in 12/24 Recheck lab C/o mid back pain with cough- had cold few week ago-  around lower rib area  Pt is here for routine follow up.    Current Medication: Outpatient Encounter Medications as of 11/15/2023  Medication Sig   atorvastatin  (LIPITOR) 20 MG tablet Take 1 tablet (20 mg total) by mouth daily.   glimepiride  (AMARYL ) 4 MG tablet Take 1 tablet (4 mg total) by mouth daily before breakfast.   lisinopril  (ZESTRIL ) 5 MG tablet Take 1 tablet (5 mg total) by mouth daily.   metFORMIN  (GLUCOPHAGE ) 1000 MG tablet Take 1 tablet (1,000 mg total) by mouth 2 (two) times daily with a meal.   polyethylene glycol powder (GLYCOLAX /MIRALAX ) powder Take 17 g by mouth 2 (two) times daily as needed. (Patient not taking: Reported on 11/15/2023)   No facility-administered encounter medications on file as of 11/15/2023.    Surgical History: No past surgical history on file.  Medical History: Past Medical History:  Diagnosis Date   Diabetes mellitus without complication (HCC)    Hypertension     Family History: No family history on file.  Social History   Socioeconomic History   Marital status: Single    Spouse name: Not on file   Number of children: Not on file   Years of education: Not on file   Highest education level: Not on file  Occupational History   Not on file  Tobacco Use   Smoking status: Every Day   Smokeless tobacco: Never   Tobacco comments:    smokes hookah 1 time a day  Substance and Sexual Activity   Alcohol use: No   Drug use: Not on file   Sexual activity: Not on file  Other Topics Concern   Not on  file  Social History Narrative   Not on file   Social Drivers of Health   Financial Resource Strain: Not on file  Food Insecurity: Not on file  Transportation Needs: Not on file  Physical Activity: Not on file  Stress: Not on file  Social Connections: Not on file  Intimate Partner Violence: Not on file      Review of Systems  Vital Signs: BP 116/71 (BP Location: Left Arm, Patient Position: Sitting)   Pulse 83   Temp (!) 97.5 F (36.4 C) (Temporal)   Ht 5' 7 (1.702 m)   Wt 156 lb (70.8 kg)   SpO2 96%   BMI 24.43 kg/m    Physical Exam  Lung- CTS- pain in rib/ MSK area on palpation   Assessment/Plan: DM HTN HLP Orfer lab F/u after lab  General Counseling: Randall Glass verbalizes understanding of the findings of todays visit and agrees with plan of treatment. I have discussed any further diagnostic evaluation that may be needed or ordered today. We also reviewed his medications today. he has been encouraged to call the office with any questions or concerns that should arise related to todays visit.    No orders of the defined types were placed in this encounter.   No orders of the defined types were  placed in this encounter.       RANSOM OTHER

## 2024-01-17 MED ORDER — LISINOPRIL 5 MG PO TABS: 5.0000 mg | ORAL_TABLET | Freq: Every day | ORAL | 3 refills | Status: AC

## 2024-01-17 MED ORDER — METFORMIN HCL 1000 MG PO TABS: 1000.0000 mg | ORAL_TABLET | Freq: Two times a day (BID) | ORAL | 1 refills | Status: AC

## 2024-01-17 MED ORDER — GLIMEPIRIDE 4 MG PO TABS: 4.0000 mg | ORAL_TABLET | Freq: Every day | ORAL | 3 refills | Status: AC

## 2024-01-17 MED ORDER — ATORVASTATIN CALCIUM 20 MG PO TABS: 20.0000 mg | ORAL_TABLET | Freq: Every day | ORAL | 3 refills | Status: DC

## 2024-01-17 NOTE — Addendum Note (Signed)
 Addended byBETHA LARI JEFFERSON on: 01/17/2024 12:24 PM   Modules accepted: Orders
